# Patient Record
Sex: Male | Born: 1955 | Race: Black or African American | Hispanic: No | Marital: Married | Smoking: Never smoker
Health system: Southern US, Community
[De-identification: ages and names within clinical notes are randomized; demographics above are authoritative.]

## PROBLEM LIST (undated history)

## (undated) DIAGNOSIS — I10 Essential (primary) hypertension: Secondary | ICD-10-CM

## (undated) DIAGNOSIS — E119 Type 2 diabetes mellitus without complications: Secondary | ICD-10-CM

---

## 1997-12-13 ENCOUNTER — Ambulatory Visit (HOSPITAL_COMMUNITY): Admission: RE | Admit: 1997-12-13 | Discharge: 1997-12-13 | Payer: Self-pay | Admitting: Neurosurgery

## 1998-01-12 ENCOUNTER — Inpatient Hospital Stay (HOSPITAL_COMMUNITY): Admission: RE | Admit: 1998-01-12 | Discharge: 1998-01-12 | Payer: Self-pay | Admitting: Neurosurgery

## 1999-04-03 ENCOUNTER — Encounter: Payer: Self-pay | Admitting: Neurosurgery

## 1999-04-05 ENCOUNTER — Inpatient Hospital Stay (HOSPITAL_COMMUNITY): Admission: RE | Admit: 1999-04-05 | Discharge: 1999-04-10 | Payer: Self-pay | Admitting: Neurosurgery

## 1999-04-05 ENCOUNTER — Encounter: Payer: Self-pay | Admitting: Neurosurgery

## 1999-05-08 ENCOUNTER — Ambulatory Visit (HOSPITAL_COMMUNITY): Admission: RE | Admit: 1999-05-08 | Discharge: 1999-05-08 | Payer: Self-pay | Admitting: Neurosurgery

## 1999-05-08 ENCOUNTER — Encounter: Payer: Self-pay | Admitting: Neurosurgery

## 1999-07-17 ENCOUNTER — Encounter: Payer: Self-pay | Admitting: Neurosurgery

## 1999-07-17 ENCOUNTER — Ambulatory Visit (HOSPITAL_COMMUNITY): Admission: RE | Admit: 1999-07-17 | Discharge: 1999-07-17 | Payer: Self-pay | Admitting: Neurosurgery

## 2002-04-17 ENCOUNTER — Encounter: Payer: Self-pay | Admitting: Neurosurgery

## 2002-04-17 ENCOUNTER — Encounter: Admission: RE | Admit: 2002-04-17 | Discharge: 2002-04-17 | Payer: Self-pay | Admitting: Neurosurgery

## 2013-07-08 DIAGNOSIS — K5900 Constipation, unspecified: Secondary | ICD-10-CM | POA: Insufficient documentation

## 2013-07-08 DIAGNOSIS — E662 Morbid (severe) obesity with alveolar hypoventilation: Secondary | ICD-10-CM | POA: Insufficient documentation

## 2013-07-08 DIAGNOSIS — R140 Abdominal distension (gaseous): Secondary | ICD-10-CM | POA: Insufficient documentation

## 2013-07-08 DIAGNOSIS — E669 Obesity, unspecified: Secondary | ICD-10-CM | POA: Insufficient documentation

## 2015-04-26 DIAGNOSIS — I10 Essential (primary) hypertension: Secondary | ICD-10-CM | POA: Insufficient documentation

## 2015-04-26 DIAGNOSIS — E118 Type 2 diabetes mellitus with unspecified complications: Secondary | ICD-10-CM | POA: Insufficient documentation

## 2016-02-16 DIAGNOSIS — I5033 Acute on chronic diastolic (congestive) heart failure: Secondary | ICD-10-CM | POA: Insufficient documentation

## 2016-02-16 DIAGNOSIS — I5032 Chronic diastolic (congestive) heart failure: Secondary | ICD-10-CM | POA: Insufficient documentation

## 2017-04-08 ENCOUNTER — Encounter: Payer: Self-pay | Admitting: Podiatry

## 2017-04-08 ENCOUNTER — Ambulatory Visit (INDEPENDENT_AMBULATORY_CARE_PROVIDER_SITE_OTHER): Payer: Non-veteran care | Admitting: Podiatry

## 2017-04-08 VITALS — BP 169/94 | HR 107 | Ht 69.0 in | Wt 329.0 lb

## 2017-04-08 DIAGNOSIS — B351 Tinea unguium: Secondary | ICD-10-CM

## 2017-04-08 DIAGNOSIS — M79672 Pain in left foot: Secondary | ICD-10-CM | POA: Diagnosis not present

## 2017-04-08 DIAGNOSIS — E0842 Diabetes mellitus due to underlying condition with diabetic polyneuropathy: Secondary | ICD-10-CM | POA: Diagnosis not present

## 2017-04-08 DIAGNOSIS — M216X1 Other acquired deformities of right foot: Secondary | ICD-10-CM

## 2017-04-08 DIAGNOSIS — M216X2 Other acquired deformities of left foot: Secondary | ICD-10-CM | POA: Diagnosis not present

## 2017-04-08 DIAGNOSIS — M722 Plantar fascial fibromatosis: Secondary | ICD-10-CM | POA: Diagnosis not present

## 2017-04-08 DIAGNOSIS — M21969 Unspecified acquired deformity of unspecified lower leg: Secondary | ICD-10-CM | POA: Diagnosis not present

## 2017-04-08 DIAGNOSIS — M79671 Pain in right foot: Secondary | ICD-10-CM

## 2017-04-08 NOTE — Patient Instructions (Signed)
Seen for painful feet, heel pain, lateral column pain, metatarsal pain. Noted of tight Achilles tendon, weak and unstable first metatarsal bone bilateral. Reviewed findings and available treatment options. All nails debrided. Cortisone injection given to both heels. Need daily stretch exercise for tight tendon. May benefit from hard shell custom orthotics. Return in 3 month for routine foot care.

## 2017-04-08 NOTE — Progress Notes (Signed)
SUBJECTIVE: 61 y.o. year old IDDM male accompanied by his wife presents complaining of pain in both feet.   HPI: Had had plantar fasciitis on both feet duration of over 5 years, arthritic pain on top and side of both feet at night duration of 4-5 years. Been using Biofreeze type cream to alleviate pain.  Has had Orthotics made through [place], but not helping. Patient believes the orthotics are too soft and flattens out with weight bearing. Also was treated with injection, which helped for a while.  In need of nails trimmed periodically. Been diabetic since 1998. Blood sugar reading was 50 this morning right after taking medication. Usually stays at about 140-150 when on medication. Can go up to 300 if he isses out on medication.  Positive history of Stiff heart, sleep apnea, massive stroke 2013, hypertension, problem with swelling stomach, fluid retention problem due to stiff heart, takes diuretics, arthritic pain in back of knees.  REVIEW OF SYSTEMS: Review of Systems  Constitutional: Negative for chills, fever and weight loss.  HENT: Negative for ear discharge, ear pain, hearing loss and tinnitus.   Eyes: Negative for blurred vision, double vision, photophobia and pain.  Respiratory: Negative for cough, hemoptysis, shortness of breath and wheezing.   Cardiovascular: Positive for leg swelling. Negative for chest pain and palpitations.  Gastrointestinal: Negative for abdominal pain, heartburn, nausea and vomiting.  Genitourinary: Negative for dysuria, frequency, hematuria and urgency.  Musculoskeletal: Positive for back pain and joint pain.  Neurological: Negative for dizziness, tingling, tremors and headaches.   OBJECTIVE: DERMATOLOGIC EXAMINATION: Thick dystrophic nails x 10.   VASCULAR EXAMINATION OF LOWER LIMBS: Pedal pulses: All pedal pulses are palpable with normal pulsation.  Capillary Filling times within 3 seconds in all digits.  No edema or erythema noted. Temperature gradient  from tibial crest to dorsum of foot is within normal bilateral.  NEUROLOGIC EXAMINATION OF THE LOWER LIMBS: Achilles DTR is present and within normal. Monofilament (Semmes-Weinstein 10-gm) sensory testing positive 6 out of 6, bilateral. Vibratory sensations(128Hz  turning fork) intact at medial and lateral forefoot bilateral.  Sharp and Dull discriminatory sensations at the plantar ball of hallux is intact bilateral.  Diagnosed with diabetic neuropathy by [facility] hospital.  MUSCULOSKELETAL EXAMINATION: Positive for weak first metatarsal bone bilateral. Tight Achilles tendon bilateral. Forefoot varus with lateral weight shifting bilateral. Pain in both heels. STJ hyperpronation with weight bearin bilateral.  ASSESSMENT: Plantar fasciitis bilateral. 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 Forefoot varus with lateral weight shifting and lesser metatarslgia bilateral. STJ hyperpronation bilateral. Diabetic neuropathy.  PLAN: Reviewed findings and available treatment options. As per request, injection given to both heels.  Both heels injected with mixture of 4 mg Dexamethasone, 4 mg Triamcinolone, and 1 cc of 0.5% Marcaine plain. Patient tolerated well without difficulty.  All nails debrided. Reviewed benefit of custom orthotics. Will try to obtain prior authorization.  Reviewed benefit of stretch exercise for tight Achilles tendon. Patient is to do daily stretch exercise.

## 2017-07-09 ENCOUNTER — Ambulatory Visit: Payer: Non-veteran care | Admitting: Podiatry

## 2017-07-16 ENCOUNTER — Ambulatory Visit (INDEPENDENT_AMBULATORY_CARE_PROVIDER_SITE_OTHER): Payer: Non-veteran care | Admitting: Podiatry

## 2017-07-16 DIAGNOSIS — M216X2 Other acquired deformities of left foot: Secondary | ICD-10-CM | POA: Diagnosis not present

## 2017-07-16 DIAGNOSIS — M21969 Unspecified acquired deformity of unspecified lower leg: Secondary | ICD-10-CM

## 2017-07-16 DIAGNOSIS — M19079 Primary osteoarthritis, unspecified ankle and foot: Secondary | ICD-10-CM

## 2017-07-16 DIAGNOSIS — M216X1 Other acquired deformities of right foot: Secondary | ICD-10-CM

## 2017-07-16 DIAGNOSIS — E0842 Diabetes mellitus due to underlying condition with diabetic polyneuropathy: Secondary | ICD-10-CM | POA: Diagnosis not present

## 2017-07-16 NOTE — Patient Instructions (Addendum)
Seen for pain in both great toe joint, nerve pain on top of foot. Heel pain has done well since the last injection. Both great toe joint injected with Cortisone. Return as needed.

## 2017-07-16 NOTE — Progress Notes (Signed)
Subjective: 62 y.o. year old male patient presents for follow up on heel pain. He is accompanied by his wife. Pain is only on top of the foot now. Been hurting on the left great toe nail area since it was cut last time. Still hurting at the edge.  Last injection for heel pain had done well. Now having severe joint pain at the great toe on both feet. Patient seeking injection to relieve pain.  HPI: Had had plantar fasciitis on both feet duration of over 5 years, arthritic pain on top and side of both feet at night duration of 4-5 years. Been using Biofreeze type cream to alleviate pain.  Has had Orthotics made through [facility], but not helping. Patient believes the orthotics are too soft and flattens out with weight bearing. Also was treated with injection, which helped for a while.  In need of nails trimmed periodically. Been diabetic since 1998. Blood sugar reading was 50 this morning right after taking medication. Usually stays at about 140-150 when on medication. Can go up to 300 if he isses out on medication.  Positive history of Stiff heart, sleep apnea, massive stroke 2013, hypertension, problem with swelling stomach, fluid retention problem due to stiff heart, takes diuretics, arthritic pain in back of knees.    Objective: Dermatologic: Abnormal nail growth left great toe medial border. Vascular: Pedal pulses are all palpable. Orthopedic: Weak first metatarsal bone with excess sagittal plane motion upon load bearing. Tight Achilles tendon on both feet. Forefoot varus with lateral weight shifting. Excess STJ hyperpronation bilateral.  Severe joint pain at the first MPJ with weight bearing bilateral. Neurologic: All epicritic and tactile sensations grossly intact.  Assessment: Arthropathy first MPJ bilateral. STJ hyperpronation. Hypermobile first ray bilateral. Achilles tendon contracture bilateral. Bilateral heel pain in remission. Diabetic neuropathy.  Treatment: As per request  both great toe joint was  Injected with mixture of 2 mg Dexamethasone, 2 mg Triamcinolone, and 1 cc of 0.5% Marcaine plain.  Patient tolerated well without difficulty.  Reviewed stretch exercise for tight Achilles tendon. Return as needed. .Marland Kitchen

## 2017-07-17 ENCOUNTER — Encounter: Payer: Self-pay | Admitting: Podiatry

## 2017-07-17 DIAGNOSIS — M19079 Primary osteoarthritis, unspecified ankle and foot: Secondary | ICD-10-CM | POA: Insufficient documentation

## 2017-10-10 ENCOUNTER — Ambulatory Visit: Payer: Non-veteran care | Admitting: Podiatry

## 2018-01-09 ENCOUNTER — Telehealth: Payer: Self-pay | Admitting: *Deleted

## 2018-01-09 NOTE — Telephone Encounter (Signed)
Patient called about [facility] auth which has expired. He states it should be good for a year. [facility] and left vm on Courtney Parisara Sharp vm(community care coordinator)

## 2020-06-15 ENCOUNTER — Emergency Department (HOSPITAL_BASED_OUTPATIENT_CLINIC_OR_DEPARTMENT_OTHER): Payer: No Typology Code available for payment source

## 2020-06-15 ENCOUNTER — Emergency Department (HOSPITAL_BASED_OUTPATIENT_CLINIC_OR_DEPARTMENT_OTHER)
Admission: EM | Admit: 2020-06-15 | Discharge: 2020-06-15 | Disposition: A | Discharge status: Home / Self Care | Payer: No Typology Code available for payment source | Attending: Emergency Medicine | Admitting: Emergency Medicine

## 2020-06-15 ENCOUNTER — Encounter (HOSPITAL_BASED_OUTPATIENT_CLINIC_OR_DEPARTMENT_OTHER): Payer: Self-pay | Admitting: *Deleted

## 2020-06-15 ENCOUNTER — Other Ambulatory Visit: Payer: Self-pay

## 2020-06-15 DIAGNOSIS — M542 Cervicalgia: Secondary | ICD-10-CM | POA: Diagnosis not present

## 2020-06-15 DIAGNOSIS — Z79899 Other long term (current) drug therapy: Secondary | ICD-10-CM | POA: Insufficient documentation

## 2020-06-15 DIAGNOSIS — E119 Type 2 diabetes mellitus without complications: Secondary | ICD-10-CM | POA: Insufficient documentation

## 2020-06-15 DIAGNOSIS — R0789 Other chest pain: Secondary | ICD-10-CM | POA: Insufficient documentation

## 2020-06-15 DIAGNOSIS — Z7982 Long term (current) use of aspirin: Secondary | ICD-10-CM | POA: Insufficient documentation

## 2020-06-15 DIAGNOSIS — I10 Essential (primary) hypertension: Secondary | ICD-10-CM | POA: Diagnosis not present

## 2020-06-15 HISTORY — DX: Essential (primary) hypertension: I10

## 2020-06-15 HISTORY — DX: Type 2 diabetes mellitus without complications: E11.9

## 2020-06-15 MED ORDER — HYDROCODONE-ACETAMINOPHEN 5-325 MG PO TABS
1.0000 | ORAL_TABLET | Freq: Once | ORAL | Status: AC
  Administered 2020-06-15: 17:00:00 1 via ORAL
  Filled 2020-06-15: qty 1
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
## 2021-06-12 ENCOUNTER — Other Ambulatory Visit: Payer: Self-pay | Admitting: Student in an Organized Health Care Education/Training Program

## 2021-06-12 DIAGNOSIS — M5416 Radiculopathy, lumbar region: Secondary | ICD-10-CM

## 2021-07-09 ENCOUNTER — Other Ambulatory Visit: Payer: Self-pay

## 2021-07-09 ENCOUNTER — Ambulatory Visit
Admission: RE | Admit: 2021-07-09 | Discharge: 2021-07-09 | Disposition: A | Discharge status: Home / Self Care | Payer: No Typology Code available for payment source | Source: Ambulatory Visit | Attending: Student in an Organized Health Care Education/Training Program | Admitting: Student in an Organized Health Care Education/Training Program

## 2021-07-09 DIAGNOSIS — M5416 Radiculopathy, lumbar region: Secondary | ICD-10-CM
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
## 2023-07-31 ENCOUNTER — Encounter (HOSPITAL_COMMUNITY): Payer: Self-pay | Admitting: Family Medicine

## 2023-08-07 ENCOUNTER — Other Ambulatory Visit (HOSPITAL_COMMUNITY): Payer: Self-pay | Admitting: Interventional Radiology

## 2023-08-07 ENCOUNTER — Encounter: Payer: Self-pay | Admitting: Family Medicine

## 2023-08-07 ENCOUNTER — Telehealth (HOSPITAL_COMMUNITY): Payer: Self-pay

## 2023-08-07 DIAGNOSIS — R7989 Other specified abnormal findings of blood chemistry: Secondary | ICD-10-CM | POA: Insufficient documentation

## 2023-08-07 DIAGNOSIS — N529 Male erectile dysfunction, unspecified: Secondary | ICD-10-CM | POA: Insufficient documentation

## 2023-08-07 DIAGNOSIS — N4883 Acquired buried penis: Secondary | ICD-10-CM | POA: Insufficient documentation

## 2023-08-07 DIAGNOSIS — I771 Stricture of artery: Secondary | ICD-10-CM

## 2023-08-07 NOTE — Telephone Encounter (Signed)
Called to schedule consult, no answer, left vm. AB

## 2023-08-16 ENCOUNTER — Ambulatory Visit (HOSPITAL_COMMUNITY)
Admission: RE | Admit: 2023-08-16 | Discharge: 2023-08-16 | Disposition: A | Discharge status: Home / Self Care | Payer: No Typology Code available for payment source | Source: Ambulatory Visit | Attending: Interventional Radiology | Admitting: Interventional Radiology

## 2023-08-16 DIAGNOSIS — I771 Stricture of artery: Secondary | ICD-10-CM

## 2023-08-20 ENCOUNTER — Other Ambulatory Visit (HOSPITAL_COMMUNITY): Payer: Self-pay | Admitting: Interventional Radiology

## 2023-08-20 DIAGNOSIS — I771 Stricture of artery: Secondary | ICD-10-CM

## 2023-08-20 HISTORY — PX: IR RADIOLOGIST EVAL & MGMT: IMG5224

## 2023-09-10 ENCOUNTER — Telehealth (HOSPITAL_COMMUNITY): Payer: Self-pay

## 2023-09-10 NOTE — Telephone Encounter (Signed)
Called to schedule diagnostic angiogram, no answer, left vm. AB  

## 2023-09-12 ENCOUNTER — Telehealth (HOSPITAL_COMMUNITY): Payer: Self-pay

## 2023-09-12 NOTE — Telephone Encounter (Signed)
 Pt will call back to schedule angio when he has a ride. AB

## 2023-09-25 ENCOUNTER — Other Ambulatory Visit: Payer: Self-pay | Admitting: Interventional Radiology

## 2023-09-25 DIAGNOSIS — Z01818 Encounter for other preprocedural examination: Secondary | ICD-10-CM

## 2023-09-26 ENCOUNTER — Encounter (HOSPITAL_COMMUNITY): Payer: Self-pay

## 2023-09-26 ENCOUNTER — Other Ambulatory Visit: Payer: Self-pay

## 2023-09-26 ENCOUNTER — Ambulatory Visit (HOSPITAL_COMMUNITY)
Admission: RE | Admit: 2023-09-26 | Discharge: 2023-09-26 | Disposition: A | Discharge status: Home / Self Care | Source: Ambulatory Visit | Attending: Interventional Radiology | Admitting: Interventional Radiology

## 2023-09-26 DIAGNOSIS — I110 Hypertensive heart disease with heart failure: Secondary | ICD-10-CM | POA: Diagnosis not present

## 2023-09-26 DIAGNOSIS — Z01818 Encounter for other preprocedural examination: Secondary | ICD-10-CM

## 2023-09-26 DIAGNOSIS — Z794 Long term (current) use of insulin: Secondary | ICD-10-CM | POA: Diagnosis not present

## 2023-09-26 DIAGNOSIS — Z8673 Personal history of transient ischemic attack (TIA), and cerebral infarction without residual deficits: Secondary | ICD-10-CM | POA: Diagnosis not present

## 2023-09-26 DIAGNOSIS — E119 Type 2 diabetes mellitus without complications: Secondary | ICD-10-CM | POA: Insufficient documentation

## 2023-09-26 DIAGNOSIS — I509 Heart failure, unspecified: Secondary | ICD-10-CM | POA: Diagnosis not present

## 2023-09-26 DIAGNOSIS — Z79899 Other long term (current) drug therapy: Secondary | ICD-10-CM | POA: Insufficient documentation

## 2023-09-26 DIAGNOSIS — Z539 Procedure and treatment not carried out, unspecified reason: Secondary | ICD-10-CM | POA: Insufficient documentation

## 2023-09-26 DIAGNOSIS — I6501 Occlusion and stenosis of right vertebral artery: Secondary | ICD-10-CM | POA: Insufficient documentation

## 2023-09-26 DIAGNOSIS — I771 Stricture of artery: Secondary | ICD-10-CM

## 2023-09-26 LAB — CBC
HCT: 41.8 % (ref 39.0–52.0)
Hemoglobin: 13.7 g/dL (ref 13.0–17.0)
MCH: 29.4 pg (ref 26.0–34.0)
MCHC: 32.8 g/dL (ref 30.0–36.0)
MCV: 89.7 fL (ref 80.0–100.0)
Platelets: 239 10*3/uL (ref 150–400)
RBC: 4.66 MIL/uL (ref 4.22–5.81)
RDW: 12.9 % (ref 11.5–15.5)
WBC: 7.4 10*3/uL (ref 4.0–10.5)
nRBC: 0 % (ref 0.0–0.2)

## 2023-09-26 LAB — BASIC METABOLIC PANEL WITH GFR
Anion gap: 11 (ref 5–15)
BUN: 32 mg/dL — ABNORMAL HIGH (ref 8–23)
CO2: 26 mmol/L (ref 22–32)
Calcium: 8.8 mg/dL — ABNORMAL LOW (ref 8.9–10.3)
Chloride: 103 mmol/L (ref 98–111)
Creatinine, Ser: 1.98 mg/dL — ABNORMAL HIGH (ref 0.61–1.24)
GFR, Estimated: 36 mL/min — ABNORMAL LOW (ref >60)
Glucose, Bld: 172 mg/dL — ABNORMAL HIGH (ref 70–99)
Potassium: 3.4 mmol/L — ABNORMAL LOW (ref 3.5–5.1)
Sodium: 140 mmol/L (ref 135–145)

## 2023-09-26 LAB — PROTIME-INR
INR: 1 (ref 0.8–1.2)
Prothrombin Time: 13.3 s (ref 11.4–15.2)

## 2023-09-26 LAB — GLUCOSE, CAPILLARY: Glucose-Capillary: 177 mg/dL — ABNORMAL HIGH (ref 70–99)

## 2023-09-26 MED ORDER — SODIUM CHLORIDE 0.9% FLUSH: 3.0000 mL | INTRAVENOUS | Status: DC | PRN
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
## 2023-09-26 NOTE — Progress Notes (Signed)
 Emergent stroke paged in and pt given the option to wait or reschedule his procedure. Pt chose to reschedule. Discharging pt per Dr Corliss Skains.

## 2023-10-02 ENCOUNTER — Other Ambulatory Visit: Payer: Self-pay | Admitting: Radiology

## 2023-10-02 DIAGNOSIS — Z9189 Other specified personal risk factors, not elsewhere classified: Secondary | ICD-10-CM

## 2023-10-02 DIAGNOSIS — T508X5A Adverse effect of diagnostic agents, initial encounter: Secondary | ICD-10-CM

## 2023-10-03 ENCOUNTER — Ambulatory Visit (HOSPITAL_COMMUNITY)
Admission: RE | Admit: 2023-10-03 | Discharge: 2023-10-03 | Disposition: A | Discharge status: Home / Self Care | Source: Ambulatory Visit | Attending: Interventional Radiology | Admitting: Interventional Radiology

## 2023-10-03 ENCOUNTER — Other Ambulatory Visit (HOSPITAL_COMMUNITY): Payer: Self-pay | Admitting: Interventional Radiology

## 2023-10-03 ENCOUNTER — Other Ambulatory Visit: Payer: Self-pay | Admitting: Interventional Radiology

## 2023-10-03 ENCOUNTER — Other Ambulatory Visit: Payer: Self-pay

## 2023-10-03 DIAGNOSIS — Z7902 Long term (current) use of antithrombotics/antiplatelets: Secondary | ICD-10-CM | POA: Insufficient documentation

## 2023-10-03 DIAGNOSIS — I110 Hypertensive heart disease with heart failure: Secondary | ICD-10-CM | POA: Insufficient documentation

## 2023-10-03 DIAGNOSIS — Z8673 Personal history of transient ischemic attack (TIA), and cerebral infarction without residual deficits: Secondary | ICD-10-CM | POA: Insufficient documentation

## 2023-10-03 DIAGNOSIS — I6501 Occlusion and stenosis of right vertebral artery: Secondary | ICD-10-CM | POA: Insufficient documentation

## 2023-10-03 DIAGNOSIS — E119 Type 2 diabetes mellitus without complications: Secondary | ICD-10-CM | POA: Diagnosis not present

## 2023-10-03 DIAGNOSIS — I509 Heart failure, unspecified: Secondary | ICD-10-CM | POA: Insufficient documentation

## 2023-10-03 DIAGNOSIS — Z79899 Other long term (current) drug therapy: Secondary | ICD-10-CM | POA: Insufficient documentation

## 2023-10-03 DIAGNOSIS — Z7985 Long-term (current) use of injectable non-insulin antidiabetic drugs: Secondary | ICD-10-CM | POA: Insufficient documentation

## 2023-10-03 DIAGNOSIS — Z794 Long term (current) use of insulin: Secondary | ICD-10-CM | POA: Diagnosis not present

## 2023-10-03 DIAGNOSIS — Z9189 Other specified personal risk factors, not elsewhere classified: Secondary | ICD-10-CM

## 2023-10-03 DIAGNOSIS — I771 Stricture of artery: Secondary | ICD-10-CM

## 2023-10-03 HISTORY — PX: IR ANGIO VERTEBRAL SEL SUBCLAVIAN INNOMINATE UNI R MOD SED: IMG5365

## 2023-10-03 HISTORY — PX: IR US GUIDE VASC ACCESS RIGHT: IMG2390

## 2023-10-03 LAB — CBC
HCT: 39.1 % (ref 39.0–52.0)
Hemoglobin: 12.9 g/dL — ABNORMAL LOW (ref 13.0–17.0)
MCH: 29.6 pg (ref 26.0–34.0)
MCHC: 33 g/dL (ref 30.0–36.0)
MCV: 89.7 fL (ref 80.0–100.0)
Platelets: 255 10*3/uL (ref 150–400)
RBC: 4.36 MIL/uL (ref 4.22–5.81)
RDW: 12.8 % (ref 11.5–15.5)
WBC: 6.6 10*3/uL (ref 4.0–10.5)
nRBC: 0 % (ref 0.0–0.2)

## 2023-10-03 LAB — APTT: aPTT: 31 s (ref 24–36)

## 2023-10-03 LAB — GLUCOSE, CAPILLARY
Glucose-Capillary: 126 mg/dL — ABNORMAL HIGH (ref 70–99)
Glucose-Capillary: 140 mg/dL — ABNORMAL HIGH (ref 70–99)

## 2023-10-03 LAB — BASIC METABOLIC PANEL WITH GFR
Anion gap: 10 (ref 5–15)
BUN: 17 mg/dL (ref 8–23)
CO2: 26 mmol/L (ref 22–32)
Calcium: 9 mg/dL (ref 8.9–10.3)
Chloride: 106 mmol/L (ref 98–111)
Creatinine, Ser: 0.98 mg/dL (ref 0.61–1.24)
GFR, Estimated: >60 mL/min (ref >60)
Glucose, Bld: 135 mg/dL — ABNORMAL HIGH (ref 70–99)
Potassium: 3.2 mmol/L — ABNORMAL LOW (ref 3.5–5.1)
Sodium: 142 mmol/L (ref 135–145)

## 2023-10-03 LAB — PROTIME-INR
INR: 1.1 (ref 0.8–1.2)
Prothrombin Time: 14.1 s (ref 11.4–15.2)

## 2023-10-03 MED ORDER — NITROGLYCERIN 1 MG/10 ML FOR IR/CATH LAB
INTRA_ARTERIAL | Status: AC
  Filled 2023-10-03: qty 10

## 2023-10-03 MED ORDER — SODIUM CHLORIDE 0.9 % IV SOLN: INTRAVENOUS | Status: AC

## 2023-10-03 MED ORDER — FENTANYL CITRATE (PF) 100 MCG/2ML IJ SOLN
INTRAMUSCULAR | Status: AC | PRN
  Administered 2023-10-03 (×2): 25 ug via INTRAVENOUS

## 2023-10-03 MED ORDER — HEPARIN SODIUM (PORCINE) 1000 UNIT/ML IJ SOLN
INTRAMUSCULAR | Status: AC
  Filled 2023-10-03: qty 10

## 2023-10-03 MED ORDER — FENTANYL CITRATE (PF) 100 MCG/2ML IJ SOLN
INTRAMUSCULAR | Status: AC
  Filled 2023-10-03: qty 2

## 2023-10-03 MED ORDER — VERAPAMIL HCL 2.5 MG/ML IV SOLN
INTRAVENOUS | Status: AC
  Filled 2023-10-03: qty 2

## 2023-10-03 MED ORDER — NITROGLYCERIN 1 MG/10 ML FOR IR/CATH LAB
INTRA_ARTERIAL | Status: AC | PRN
  Administered 2023-10-03: 200 ug via INTRA_ARTERIAL

## 2023-10-03 MED ORDER — HEPARIN SODIUM (PORCINE) 1000 UNIT/ML IJ SOLN
INTRAMUSCULAR | Status: AC
Start: 2023-10-03 — End: ?
  Filled 2023-10-03: qty 10

## 2023-10-03 MED ORDER — VERAPAMIL HCL 2.5 MG/ML IV SOLN: INTRA_ARTERIAL | Status: AC | PRN

## 2023-10-03 MED ORDER — MIDAZOLAM HCL 2 MG/2ML IJ SOLN
INTRAMUSCULAR | Status: AC | PRN
  Administered 2023-10-03: 1 mg via INTRAVENOUS

## 2023-10-03 MED ORDER — LIDOCAINE HCL 1 % IJ SOLN
INTRAMUSCULAR | Status: AC
  Filled 2023-10-03: qty 20

## 2023-10-03 MED ORDER — TICAGRELOR 90 MG PO TABS: ORAL_TABLET | ORAL | 0 refills | Status: AC

## 2023-10-03 MED ORDER — MIDAZOLAM HCL 2 MG/2ML IJ SOLN
INTRAMUSCULAR | Status: AC
  Filled 2023-10-03: qty 2

## 2023-10-03 MED ORDER — LIDOCAINE HCL 1 % IJ SOLN
20.0000 mL | Freq: Once | INTRAMUSCULAR | Status: AC
  Administered 2023-10-03: 5 mL via INTRADERMAL

## 2023-10-03 MED ORDER — IOHEXOL 300 MG/ML  SOLN
150.0000 mL | Freq: Once | INTRAMUSCULAR | Status: AC | PRN
  Administered 2023-10-03: 35 mL via INTRA_ARTERIAL
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
## 2023-10-03 NOTE — Discharge Instructions (Signed)
 Radial Site Care  This sheet gives you information about how to care for yourself after your procedure. Your health care provider may also give you more specific instructions. If you have problems or questions, contact your health care provider. What can I expect after the procedure? After the procedure, it is common to have: Bruising and tenderness at the catheter insertion area. Follow these instructions at home: Medicines Take over-the-counter and prescription medicines only as told by your health care provider. Insertion site care Follow instructions from your health care provider about how to take care of your insertion site. Make sure you: Wash your hands with soap and water before you remove your bandage (dressing). If soap and water are not available, use hand sanitizer. May remove dressing in 24 hours. Check your insertion site every day for signs of infection. Check for: Redness, swelling, or pain. Fluid or blood. Pus or a bad smell. Warmth. Do no take baths, swim, or use a hot tub for 5 days. You may shower 24-48 hours after the procedure. Remove the dressing and gently wash the site with plain soap and water. Pat the area dry with a clean towel. Do not rub the site. That could cause bleeding. Do not apply powder or lotion to the site. Activity  For 24 hours after the procedure, or as directed by your health care provider: Do not flex or bend the affected arm. Do not push or pull heavy objects with the affected arm. Do not drive yourself home from the hospital or clinic. You may drive 24 hours after the procedure. Do not operate machinery or power tools. KEEP ARM ELEVATED THE REMAINDER OF THE DAY. Do not push, pull or lift anything that is heavier than 10 lb for 5 days. Ask your health care provider when it is okay to: Return to work or school. Resume usual physical activities or sports. Resume sexual activity. General instructions If the catheter site starts to  bleed, raise your arm and put firm pressure on the site. If the bleeding does not stop, get help right away. This is a medical emergency. DRINK PLENTY OF FLUIDS FOR THE NEXT 2-3 DAYS. No alcohol consumption for 24 hours after receiving sedation. If you went home on the same day as your procedure, a responsible adult should be with you for the first 24 hours after you arrive home. Keep all follow-up visits as told by your health care provider. This is important. Contact a health care provider if: You have a fever. You have redness, swelling, or yellow drainage around your insertion site. Get help right away if: You have unusual pain at the radial site. The catheter insertion area swells very fast. The insertion area is bleeding, and the bleeding does not stop when you hold steady pressure on the area. Your arm or hand becomes pale, cool, tingly, or numb. These symptoms may represent a serious problem that is an emergency. Do not wait to see if the symptoms will go away. Get medical help right away. Call your local emergency services (911 in the [place] Do not drive yourself to the hospital. Summary After the procedure, it is common to have bruising and tenderness at the site. Follow instructions from your health care provider about how to take care of your radial site wound. Check the wound every day for signs of infection.  This information is not intended to replace advice given to you by your health care provider. Make sure you discuss any questions you have with  your health care provider. Document Revised: 07/17/2017 Document Reviewed: 07/17/2017 Elsevier Patient Education  2020 [employer].

## 2023-10-03 NOTE — Procedures (Signed)
 INR.  Status post right vertebral angiogram.  Right radial approach.  Findings.  1.  Preocclusive stenosis of the origin of the dominant right vertebral artery.  Suggestion of  moderate stenosis of the distal right vertebral artery at the skull base.  Carotids not accessible from the right radial route due to patient anatomy.  Fatima Sanger MD.

## 2023-10-09 ENCOUNTER — Telehealth (HOSPITAL_COMMUNITY): Payer: Self-pay

## 2023-10-09 NOTE — Telephone Encounter (Signed)
Returned call, no answer, left vm. AB

## 2023-10-24 ENCOUNTER — Telehealth (HOSPITAL_COMMUNITY): Payer: Self-pay

## 2023-10-24 NOTE — Telephone Encounter (Signed)
 Will call pt back when I return 11/04/23 to see about scheduling procedure. He wants to wait so he can work and save up money for when he is out for the procedure. AB

## 2023-10-31 DIAGNOSIS — Z961 Presence of intraocular lens: Secondary | ICD-10-CM | POA: Insufficient documentation

## 2023-10-31 DIAGNOSIS — I639 Cerebral infarction, unspecified: Secondary | ICD-10-CM | POA: Insufficient documentation

## 2023-10-31 DIAGNOSIS — E291 Testicular hypofunction: Secondary | ICD-10-CM | POA: Insufficient documentation

## 2023-10-31 DIAGNOSIS — R279 Unspecified lack of coordination: Secondary | ICD-10-CM | POA: Insufficient documentation

## 2023-10-31 DIAGNOSIS — I110 Hypertensive heart disease with heart failure: Secondary | ICD-10-CM | POA: Insufficient documentation

## 2023-10-31 DIAGNOSIS — M109 Gout, unspecified: Secondary | ICD-10-CM | POA: Insufficient documentation

## 2023-10-31 DIAGNOSIS — E559 Vitamin D deficiency, unspecified: Secondary | ICD-10-CM | POA: Insufficient documentation

## 2023-10-31 DIAGNOSIS — N51 Disorders of male genital organs in diseases classified elsewhere: Secondary | ICD-10-CM | POA: Insufficient documentation

## 2023-10-31 DIAGNOSIS — M533 Sacrococcygeal disorders, not elsewhere classified: Secondary | ICD-10-CM | POA: Insufficient documentation

## 2023-10-31 DIAGNOSIS — M7918 Myalgia, other site: Secondary | ICD-10-CM | POA: Insufficient documentation

## 2023-10-31 DIAGNOSIS — M79673 Pain in unspecified foot: Secondary | ICD-10-CM | POA: Insufficient documentation

## 2023-10-31 DIAGNOSIS — H25019 Cortical age-related cataract, unspecified eye: Secondary | ICD-10-CM | POA: Insufficient documentation

## 2023-10-31 DIAGNOSIS — D126 Benign neoplasm of colon, unspecified: Secondary | ICD-10-CM | POA: Insufficient documentation

## 2023-10-31 DIAGNOSIS — E119 Type 2 diabetes mellitus without complications: Secondary | ICD-10-CM | POA: Insufficient documentation

## 2023-10-31 DIAGNOSIS — G8929 Other chronic pain: Secondary | ICD-10-CM | POA: Insufficient documentation

## 2023-10-31 DIAGNOSIS — I6350 Cerebral infarction due to unspecified occlusion or stenosis of unspecified cerebral artery: Secondary | ICD-10-CM | POA: Insufficient documentation

## 2023-10-31 DIAGNOSIS — M542 Cervicalgia: Secondary | ICD-10-CM | POA: Insufficient documentation

## 2023-10-31 DIAGNOSIS — G4730 Sleep apnea, unspecified: Secondary | ICD-10-CM | POA: Insufficient documentation

## 2023-10-31 DIAGNOSIS — M4302 Spondylolysis, cervical region: Secondary | ICD-10-CM | POA: Insufficient documentation

## 2023-10-31 DIAGNOSIS — S93409A Sprain of unspecified ligament of unspecified ankle, initial encounter: Secondary | ICD-10-CM | POA: Insufficient documentation

## 2023-10-31 DIAGNOSIS — M25561 Pain in right knee: Secondary | ICD-10-CM | POA: Insufficient documentation

## 2023-10-31 DIAGNOSIS — E785 Hyperlipidemia, unspecified: Secondary | ICD-10-CM | POA: Insufficient documentation

## 2023-10-31 DIAGNOSIS — E1140 Type 2 diabetes mellitus with diabetic neuropathy, unspecified: Secondary | ICD-10-CM | POA: Insufficient documentation

## 2023-10-31 DIAGNOSIS — R0789 Other chest pain: Secondary | ICD-10-CM | POA: Insufficient documentation

## 2023-10-31 DIAGNOSIS — J309 Allergic rhinitis, unspecified: Secondary | ICD-10-CM | POA: Insufficient documentation

## 2023-10-31 DIAGNOSIS — I6501 Occlusion and stenosis of right vertebral artery: Secondary | ICD-10-CM | POA: Insufficient documentation

## 2023-10-31 DIAGNOSIS — M545 Low back pain, unspecified: Secondary | ICD-10-CM | POA: Insufficient documentation

## 2023-12-21 ENCOUNTER — Encounter (HOSPITAL_COMMUNITY): Payer: Self-pay | Admitting: Interventional Radiology

## 2024-04-23 ENCOUNTER — Other Ambulatory Visit (HOSPITAL_COMMUNITY): Payer: Self-pay | Admitting: Radiology

## 2024-04-23 DIAGNOSIS — I771 Stricture of artery: Secondary | ICD-10-CM

## 2024-04-27 ENCOUNTER — Inpatient Hospital Stay
Admission: RE | Admit: 2024-04-27 | Discharge: 2024-04-27 | Disposition: A | Payer: Self-pay | Source: Ambulatory Visit | Attending: Neurosurgery | Admitting: Neurosurgery

## 2024-04-27 ENCOUNTER — Other Ambulatory Visit: Payer: Self-pay

## 2024-04-27 DIAGNOSIS — Z0101 Encounter for examination of eyes and vision with abnormal findings: Secondary | ICD-10-CM | POA: Insufficient documentation

## 2024-04-27 DIAGNOSIS — K047 Periapical abscess without sinus: Secondary | ICD-10-CM | POA: Insufficient documentation

## 2024-04-27 DIAGNOSIS — M609 Myositis, unspecified: Secondary | ICD-10-CM | POA: Insufficient documentation

## 2024-04-27 DIAGNOSIS — Z049 Encounter for examination and observation for unspecified reason: Secondary | ICD-10-CM

## 2024-04-27 DIAGNOSIS — E1169 Type 2 diabetes mellitus with other specified complication: Secondary | ICD-10-CM | POA: Insufficient documentation

## 2024-04-27 DIAGNOSIS — J387 Other diseases of larynx: Secondary | ICD-10-CM | POA: Insufficient documentation

## 2024-04-27 DIAGNOSIS — I10 Essential (primary) hypertension: Secondary | ICD-10-CM | POA: Insufficient documentation

## 2024-04-27 DIAGNOSIS — M5430 Sciatica, unspecified side: Secondary | ICD-10-CM | POA: Insufficient documentation

## 2024-04-27 DIAGNOSIS — M179 Osteoarthritis of knee, unspecified: Secondary | ICD-10-CM | POA: Insufficient documentation

## 2024-04-27 DIAGNOSIS — Z7189 Other specified counseling: Secondary | ICD-10-CM | POA: Insufficient documentation

## 2024-04-27 DIAGNOSIS — N471 Phimosis: Secondary | ICD-10-CM | POA: Insufficient documentation

## 2024-04-27 DIAGNOSIS — M722 Plantar fascial fibromatosis: Secondary | ICD-10-CM | POA: Insufficient documentation

## 2024-04-27 DIAGNOSIS — M5442 Lumbago with sciatica, left side: Secondary | ICD-10-CM | POA: Insufficient documentation

## 2024-04-27 DIAGNOSIS — N529 Male erectile dysfunction, unspecified: Secondary | ICD-10-CM | POA: Insufficient documentation

## 2024-04-27 DIAGNOSIS — M5441 Lumbago with sciatica, right side: Secondary | ICD-10-CM | POA: Insufficient documentation

## 2024-04-27 DIAGNOSIS — M542 Cervicalgia: Secondary | ICD-10-CM | POA: Insufficient documentation

## 2024-04-29 ENCOUNTER — Ambulatory Visit (INDEPENDENT_AMBULATORY_CARE_PROVIDER_SITE_OTHER): Payer: Self-pay | Admitting: Neuroradiology

## 2024-04-29 ENCOUNTER — Ambulatory Visit
Admission: RE | Admit: 2024-04-29 | Discharge: 2024-04-29 | Disposition: A | Discharge status: Home / Self Care | Payer: Self-pay | Source: Ambulatory Visit | Attending: Neurosurgery | Admitting: Neurosurgery

## 2024-04-29 ENCOUNTER — Ambulatory Visit
Admission: RE | Admit: 2024-04-29 | Discharge: 2024-04-29 | Disposition: A | Discharge status: Home / Self Care | Payer: Self-pay | Source: Ambulatory Visit | Attending: Neuroradiology | Admitting: Neuroradiology

## 2024-04-29 ENCOUNTER — Other Ambulatory Visit: Payer: Self-pay

## 2024-04-29 ENCOUNTER — Encounter: Payer: Self-pay | Admitting: Neuroradiology

## 2024-04-29 VITALS — BP 153/85 | HR 87 | Ht 69.0 in | Wt 321.0 lb

## 2024-04-29 DIAGNOSIS — Z049 Encounter for examination and observation for unspecified reason: Secondary | ICD-10-CM

## 2024-04-29 DIAGNOSIS — I6501 Occlusion and stenosis of right vertebral artery: Secondary | ICD-10-CM | POA: Diagnosis not present

## 2024-04-29 DIAGNOSIS — R42 Dizziness and giddiness: Secondary | ICD-10-CM

## 2024-04-29 DIAGNOSIS — Z8673 Personal history of transient ischemic attack (TIA), and cerebral infarction without residual deficits: Secondary | ICD-10-CM | POA: Diagnosis not present
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
